# Patient Record
Sex: Female | Born: 1985 | Race: White | Hispanic: No | Marital: Married | Smoking: Never smoker
Health system: Southern US, Community
[De-identification: ages and names within clinical notes are randomized; demographics above are authoritative.]

---

## 2020-11-22 ENCOUNTER — Other Ambulatory Visit: Payer: Self-pay

## 2020-11-22 ENCOUNTER — Ambulatory Visit (HOSPITAL_COMMUNITY)
Admission: EM | Admit: 2020-11-22 | Discharge: 2020-11-22 | Disposition: A | Discharge status: Home / Self Care | Payer: Managed Care, Other (non HMO) | Attending: Family Medicine | Admitting: Family Medicine

## 2020-11-22 ENCOUNTER — Encounter (HOSPITAL_COMMUNITY): Payer: Self-pay | Admitting: Emergency Medicine

## 2020-11-22 ENCOUNTER — Ambulatory Visit (INDEPENDENT_AMBULATORY_CARE_PROVIDER_SITE_OTHER): Payer: Managed Care, Other (non HMO)

## 2020-11-22 DIAGNOSIS — J189 Pneumonia, unspecified organism: Secondary | ICD-10-CM | POA: Insufficient documentation

## 2020-11-22 DIAGNOSIS — Z20822 Contact with and (suspected) exposure to covid-19: Secondary | ICD-10-CM | POA: Insufficient documentation

## 2020-11-22 DIAGNOSIS — R197 Diarrhea, unspecified: Secondary | ICD-10-CM | POA: Insufficient documentation

## 2020-11-22 DIAGNOSIS — R519 Headache, unspecified: Secondary | ICD-10-CM | POA: Diagnosis present

## 2020-11-22 LAB — SARS CORONAVIRUS 2 (TAT 6-24 HRS): SARS Coronavirus 2: NEGATIVE

## 2020-11-22 MED ORDER — AMOXICILLIN 500 MG PO CAPS: 1000.0000 mg | ORAL_CAPSULE | Freq: Three times a day (TID) | ORAL | 0 refills | Status: AC

## 2020-11-22 MED ORDER — ACETAMINOPHEN 160 MG/5ML PO SOLN
650.0000 mg | Freq: Once | ORAL | Status: AC
  Administered 2020-11-22: 650 mg via ORAL

## 2020-11-22 MED ORDER — ACETAMINOPHEN 325 MG PO TABS: 650.0000 mg | ORAL_TABLET | Freq: Once | ORAL | Status: DC

## 2020-11-22 MED ORDER — ACETAMINOPHEN 160 MG/5ML PO SOLN
ORAL | Status: AC
  Filled 2020-11-22: qty 20.3

## 2020-11-22 MED ORDER — AZITHROMYCIN 250 MG PO TABS: ORAL_TABLET | ORAL | 0 refills | Status: DC

## 2020-11-22 NOTE — ED Triage Notes (Signed)
PT C/O: cold sx onset yesterday associated w/severe headache, cough, body aches, bilateral flank pain d/t coughing, fever, vomiting, diarrhea   TAKING MEDS: OTC Nyquil, Acetaminophen, Ibuprofen  A&O x4... NAD... Ambulatory

## 2020-11-22 NOTE — Discharge Instructions (Addendum)
You need a follow up chest x ray in 4 weeks You need two antibiotics Take a probiotic while on the antibiotics Take amoxicillin 1 g 3x a day for 10 days Take the z pak as directed Drink lots of water

## 2020-11-22 NOTE — ED Provider Notes (Signed)
[facility]    CSN: [id number] Arrival date & time: 11/22/20  1331      History   Chief Complaint Chief Complaint  Patient presents with  . URI    HPI [facility] Lamb is a 34 y.o. female.   HPI   Patient is here for an upper respiratory infection.  She has been having cough for a couple of days.  Has developed fever, headache, body aches, pain in the left flank area with deep breath and with movement.  She has had some nausea no vomiting.  Loose bowels.  No known exposure to Covid.  She had Covid a year ago.  She has not had Covid vaccinations  History reviewed. No pertinent past medical history.  There are no problems to display for this patient.   History reviewed. No pertinent surgical history.  OB History   No obstetric history on file.      Home Medications    Prior to Admission medications   Medication Sig Start Date End Date Taking? Authorizing Provider  amoxicillin (AMOXIL) 500 MG capsule Take 2 capsules (1,000 mg total) by mouth 3 (three) times daily for 10 days. 11/22/20 12/02/20 Yes Eustace Moore, MD  azithromycin (ZITHROMAX Z-PAK) 250 MG tablet Take two pills today followed by one a day until gone 11/22/20  Yes Delton See Letta Pate, MD    Family History History reviewed. No pertinent family history.  Social History Social History   Tobacco Use  . Smoking status: Never Smoker  . Smokeless tobacco: Never Used  Substance Use Topics  . Alcohol use: Yes     Allergies   Patient has no known allergies.   Review of Systems Review of Systems See HPI  Physical Exam Triage Vital Signs ED Triage Vitals  Enc Vitals Group     BP 11/22/20 1617 116/66     Pulse Rate 11/22/20 1617 (!) 122     Resp 11/22/20 1617 (!) 22     Temp 11/22/20 1617 (!) 102.9 F (39.4 C)     Temp Source 11/22/20 1617 Oral     SpO2 11/22/20 1617 100 %     Weight --      Height --      Head Circumference --      Peak Flow --      Pain Score 11/22/20 1614  7     Pain Loc --      Pain Edu? --      Excl. in GC? --    No data found.  Updated Vital Signs BP 116/66 (BP Location: Left Arm)   Pulse (!) 122   Temp (!) 102.9 F (39.4 C) (Oral)   Resp (!) 22   LMP 11/15/2020   SpO2 100%      Physical Exam Constitutional:      General: She is not in acute distress.    Appearance: She is well-developed, normal weight and well-nourished. She is ill-appearing.  HENT:     Head: Normocephalic and atraumatic.     Mouth/Throat:     Mouth: Oropharynx is clear and moist. Mucous membranes are moist.  Eyes:     Conjunctiva/sclera: Conjunctivae normal.     Pupils: Pupils are equal, round, and reactive to light.  Cardiovascular:     Rate and Rhythm: Normal rate and regular rhythm.     Heart sounds: Normal heart sounds.  Pulmonary:     Effort: Pulmonary effort is normal. No respiratory distress.     Breath  sounds: Rales present.     Comments: Rales l base Abdominal:     General: There is no distension.     Palpations: Abdomen is soft.  Musculoskeletal:        General: No edema. Normal range of motion.     Cervical back: Normal range of motion.  Skin:    General: Skin is warm and dry.  Neurological:     Mental Status: She is alert.  Psychiatric:        Behavior: Behavior normal.      UC Treatments / Results  Labs (all labs ordered are listed, but only abnormal results are displayed) Labs Reviewed  SARS CORONAVIRUS 2 (TAT 6-24 HRS)    EKG   Radiology DG Chest 2 View  Result Date: 11/22/2020 CLINICAL DATA:  Rales EXAM: CHEST - 2 VIEW COMPARISON:  None. FINDINGS: Rounded patchy opacity with poorly defined margins in the posterior aspect of the left lower lobe, measuring approximately 9.6 cm in maximum diameter. Clear right lung. Normal sized heart. Mild dextroconvex scoliosis. IMPRESSION: Left lower lobe pneumonia. Followup PA and lateral chest X-ray is recommended in 3-4 weeks following trial of antibiotic therapy to ensure  resolution and exclude underlying malignancy. Electronically Signed   By: Beckie Salts M.D.   On: 11/22/2020 17:03    Procedures Procedures (including critical care time)  Medications Ordered in UC Medications  acetaminophen (TYLENOL) 160 MG/5ML solution 650 mg (650 mg Oral Given 11/22/20 1623)    Initial Impression / Assessment and Plan / UC Course  I have reviewed the triage vital signs and the nursing notes.  Pertinent labs & imaging results that were available during my care of the patient were reviewed by me and considered in my medical decision making (see chart for details).     Lobar pneumonia.  Doing viral testing COVID anyway, needed for work Follow up essential  Final Clinical Impressions(s) / UC Diagnoses   Final diagnoses:  Community acquired pneumonia of left lower lobe of lung     Discharge Instructions     You need a follow up chest x ray in 4 weeks You need two antibiotics Take a probiotic while on the antibiotics Take amoxicillin 1 g 3x a day for 10 days Take the z pak as directed Drink lots of water   ED Prescriptions    Medication Sig Dispense Auth. Provider   amoxicillin (AMOXIL) 500 MG capsule Take 2 capsules (1,000 mg total) by mouth 3 (three) times daily for 10 days. 60 capsule Eustace Moore, MD   azithromycin (ZITHROMAX Z-PAK) 250 MG tablet Take two pills today followed by one a day until gone 6 tablet Delton See Letta Pate, MD     PDMP not reviewed this encounter.   Eustace Moore, MD 11/22/20 [phone]

## 2021-01-02 ENCOUNTER — Other Ambulatory Visit: Payer: Self-pay

## 2021-01-02 ENCOUNTER — Ambulatory Visit (HOSPITAL_COMMUNITY)
Admission: EM | Admit: 2021-01-02 | Discharge: 2021-01-02 | Disposition: A | Discharge status: Home / Self Care | Payer: Managed Care, Other (non HMO) | Attending: Urgent Care | Admitting: Urgent Care

## 2021-01-02 ENCOUNTER — Ambulatory Visit (INDEPENDENT_AMBULATORY_CARE_PROVIDER_SITE_OTHER): Payer: Managed Care, Other (non HMO)

## 2021-01-02 ENCOUNTER — Encounter (HOSPITAL_COMMUNITY): Payer: Self-pay

## 2021-01-02 DIAGNOSIS — R0789 Other chest pain: Secondary | ICD-10-CM

## 2021-01-02 DIAGNOSIS — R059 Cough, unspecified: Secondary | ICD-10-CM

## 2021-01-02 DIAGNOSIS — Z8709 Personal history of other diseases of the respiratory system: Secondary | ICD-10-CM | POA: Diagnosis not present

## 2021-01-02 DIAGNOSIS — Z8701 Personal history of pneumonia (recurrent): Secondary | ICD-10-CM

## 2021-01-02 NOTE — ED Triage Notes (Signed)
Pt presents with tightness in chest and states she noticed gurgling on left side of lung.

## 2021-01-02 NOTE — ED Provider Notes (Signed)
[facility]   MRN: [id number] DOB: 12-29-1985  Subjective:   Sabrina Lamb is a 35 y.o. female presenting for recheck for intermittent cough to break up mucus in her lungs and feeling of rattling in her lungs still about once a week.  Patient was diagnosed with a left lower lobe pneumonia 11/22/2020.  She underwent a course of amoxicillin and azithromycin as per up-to-date.  She completed the entire course of antibiotics and reports that she has improved but still has some concerned she hears rattling in her chest.  Denies active fever, chest pain, shortness of breath.  Denies history of lung disorders.  Patient is not a smoker.  She is otherwise healthy.  She is not currently taking any medications and has no known food or drug allergies.  Denies past medical and surgical history.  History reviewed. No pertinent family history.  Social History   Tobacco Use  . Smoking status: Never Smoker  . Smokeless tobacco: Never Used  Substance Use Topics  . Alcohol use: Yes    ROS   Objective:   Vitals: BP 117/76 (BP Location: Left Arm)   Pulse 78   Temp 97.7 F (36.5 C) (Oral)   LMP 12/26/2020   SpO2 100%   Physical Exam Constitutional:      General: She is not in acute distress.    Appearance: Normal appearance. She is well-developed. She is not ill-appearing, toxic-appearing or diaphoretic.  HENT:     Head: Normocephalic and atraumatic.     Nose: Nose normal.     Mouth/Throat:     Mouth: Mucous membranes are moist.  Eyes:     Extraocular Movements: Extraocular movements intact.     Pupils: Pupils are equal, round, and reactive to light.  Cardiovascular:     Rate and Rhythm: Normal rate and regular rhythm.     Pulses: Normal pulses.     Heart sounds: Normal heart sounds. No murmur heard. No friction rub. No gallop.   Pulmonary:     Effort: Pulmonary effort is normal. No respiratory distress.     Breath sounds: Normal breath sounds. No stridor. No  wheezing, rhonchi or rales.  Skin:    General: Skin is warm and dry.     Findings: No rash.  Neurological:     Mental Status: She is alert and oriented to person, place, and time.  Psychiatric:        Mood and Affect: Mood normal.        Behavior: Behavior normal.        Thought Content: Thought content normal.     DG Chest 2 View  Result Date: 01/02/2021 CLINICAL DATA:  Follow-up pneumonia. EXAM: CHEST - 2 VIEW COMPARISON:  Chest two-view 11/12/2020 FINDINGS: Interval clearing of left lower lobe pneumonia. Lungs now clear without infiltrate or effusion. Heart size and vascularity normal. IMPRESSION: No active cardiopulmonary disease. Interval clearing of left lower lobe pneumonia. Electronically Signed   By: Marlan Palau M.D.   On: 01/02/2021 14:36    Assessment and Plan :   PDMP not reviewed this encounter.  1. Chest discomfort   2. History of pneumonia   3. Cough     Pneumonia is resolved. Vital signs and physical exam findings reassuring. Discussed conservative management with patient. Will hold off on steroids, more antibiotics. Counseled patient on potential for adverse effects with medications prescribed/recommended today, ER and return-to-clinic precautions discussed, patient verbalized understanding.    Wallis Bamberg, PA-C 01/02/21 1450

## 2022-02-04 IMAGING — DX DG CHEST 2V
2 series · 2 of 2 positions shown · non-contrast
Comparison: None.

CLINICAL DATA: Rales

EXAM:
CHEST - 2 VIEW

[chest pa]
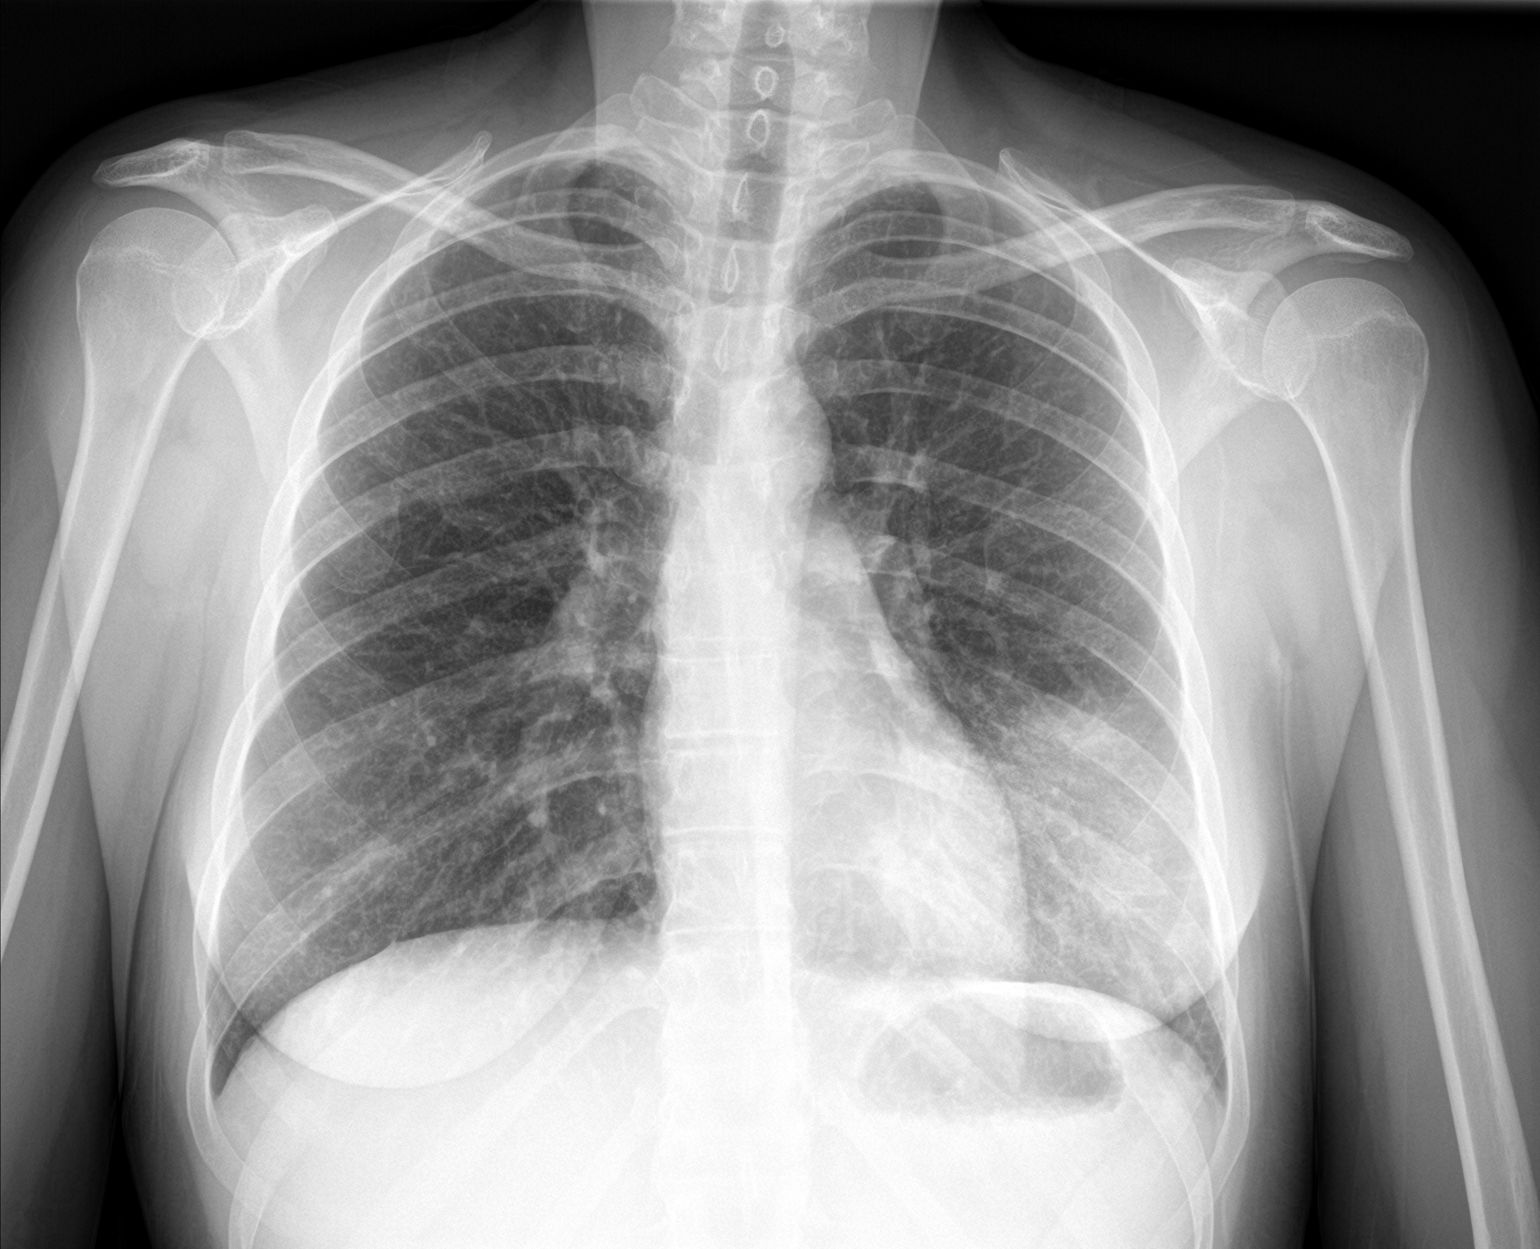

[chest lat]
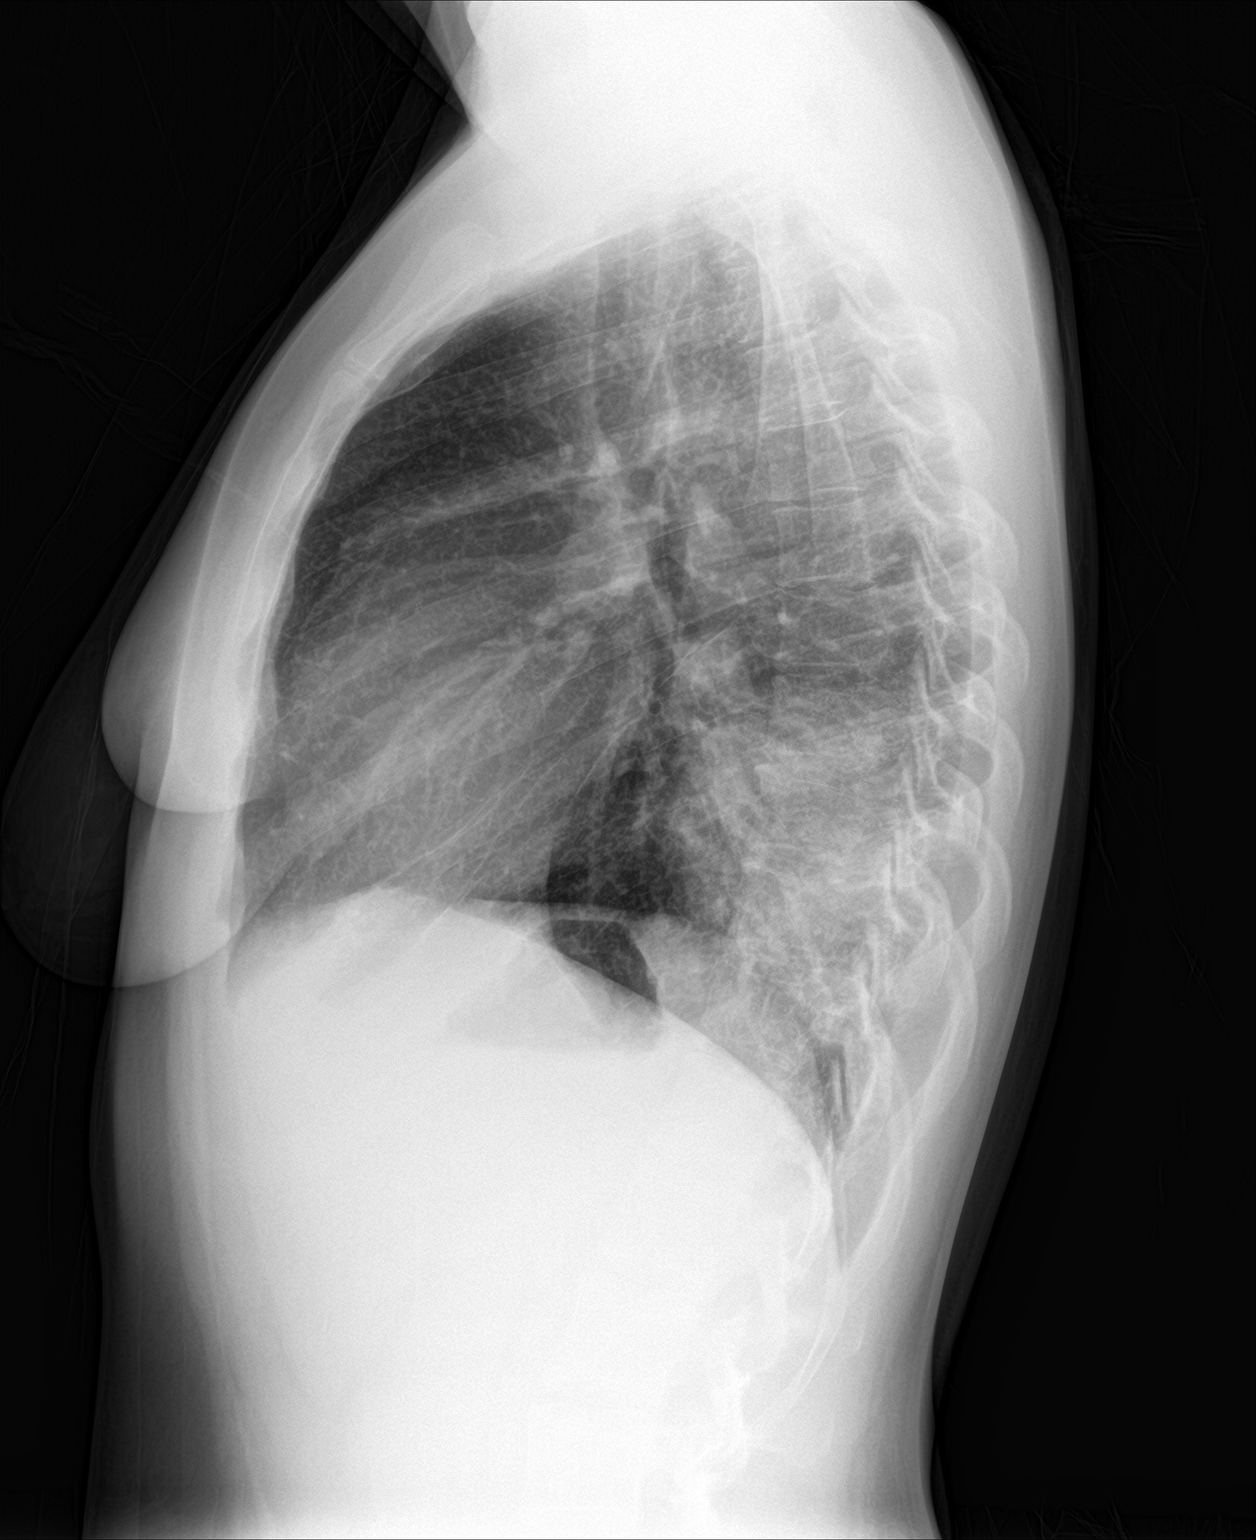

[2 of 2 positions shown; findings below may reference images not displayed]

FINDINGS: Rounded patchy opacity with poorly defined margins in the posterior
aspect of the left lower lobe, measuring approximately 9.6 cm in
maximum diameter. Clear right lung. Normal sized heart. Mild
dextroconvex scoliosis.
IMPRESSION: Left lower lobe pneumonia. Followup PA and lateral chest X-ray is
recommended in 3-4 weeks following trial of antibiotic therapy to
ensure resolution and exclude underlying malignancy.

## 2022-03-17 IMAGING — DX DG CHEST 2V
2 series · 2 of 2 positions shown · non-contrast
Comparison: Chest two-view 11/12/2020

CLINICAL DATA: Follow-up pneumonia.

EXAM:
CHEST - 2 VIEW

[chest pa]
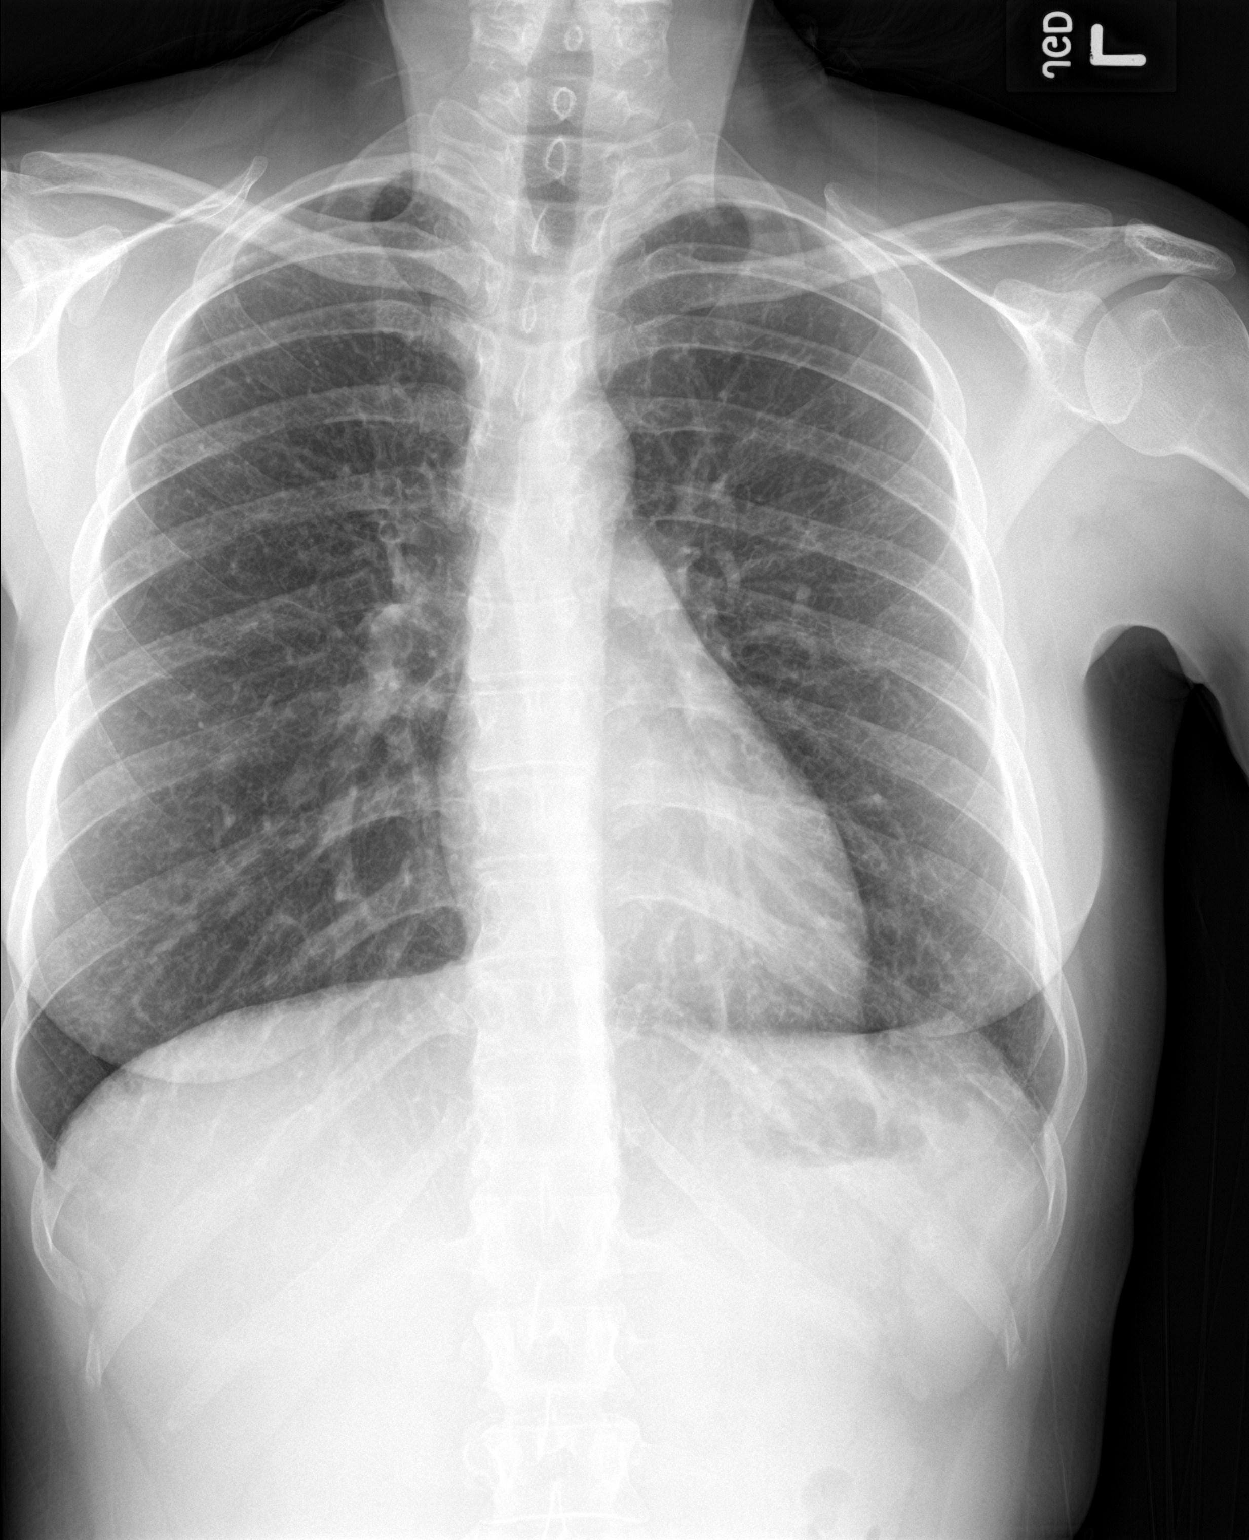

[chest lat]
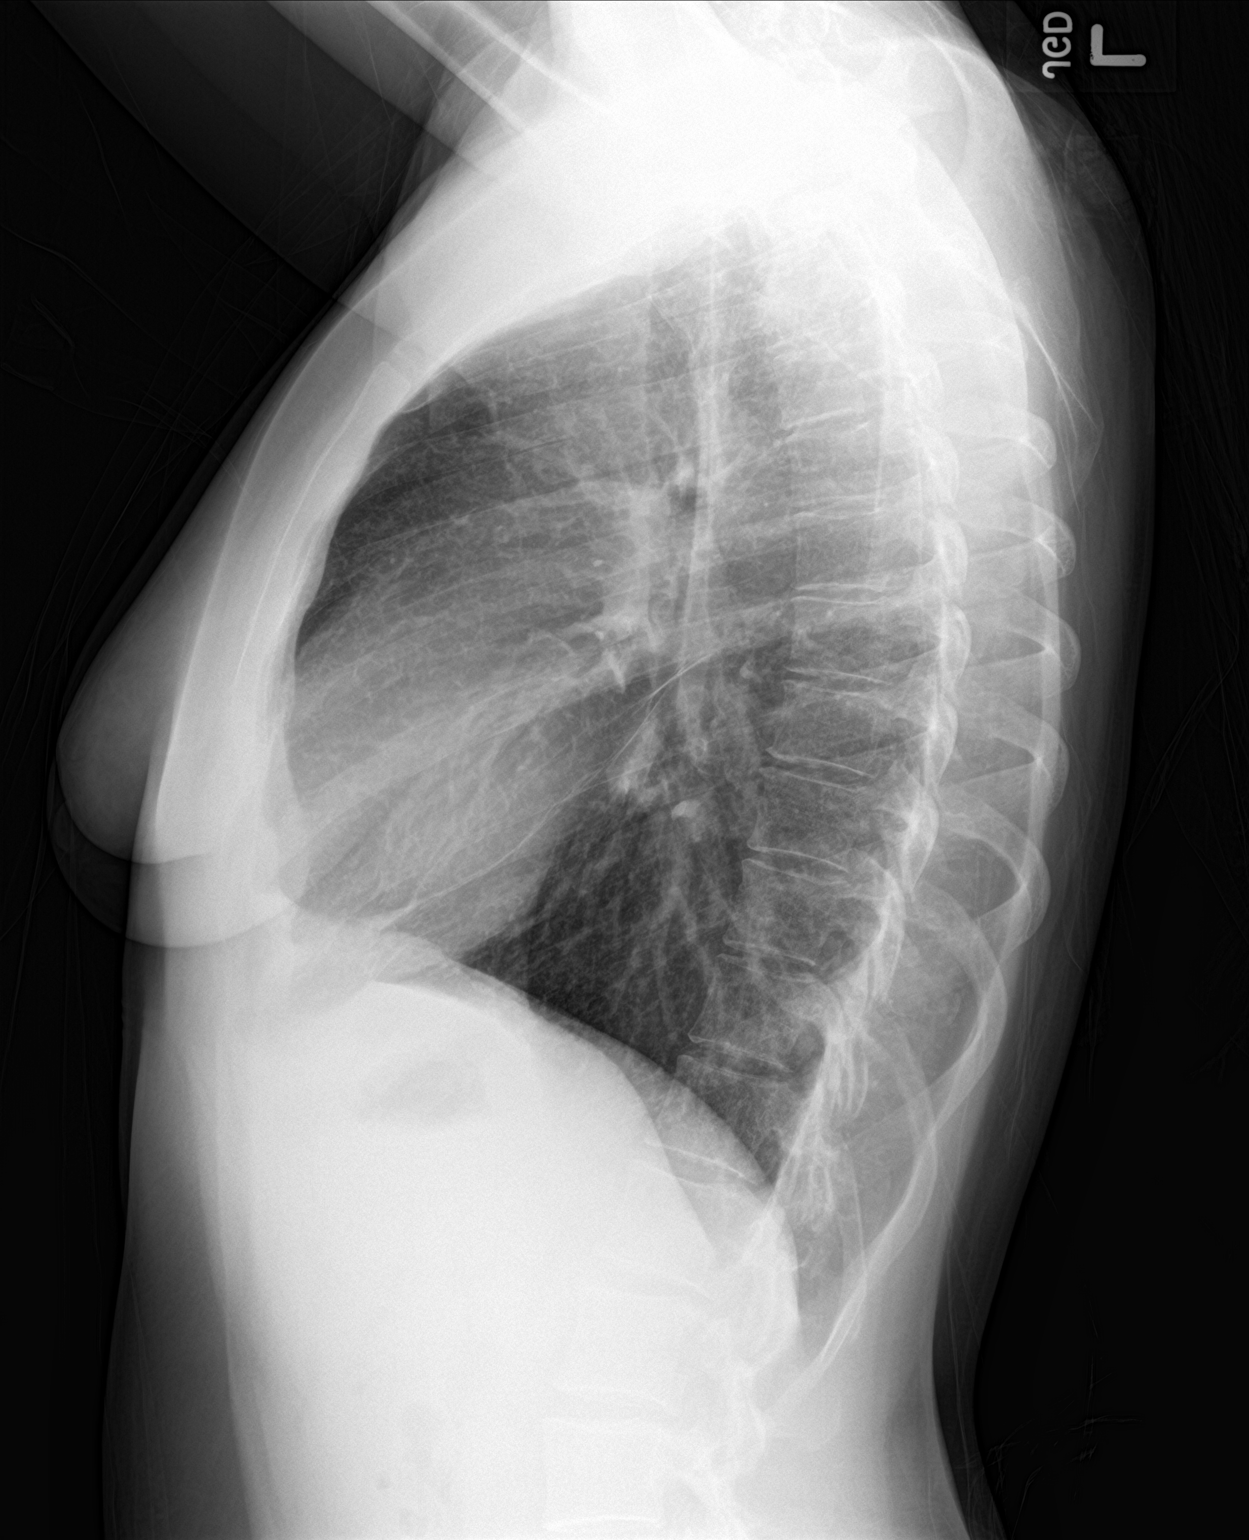

[2 of 2 positions shown; findings below may reference images not displayed]

FINDINGS: Interval clearing of left lower lobe pneumonia. Lungs now clear
without infiltrate or effusion. Heart size and vascularity normal.
IMPRESSION: No active cardiopulmonary disease. Interval clearing of left lower
lobe pneumonia.
